# Patient Record
Sex: Female | Born: 1985 | Race: Black or African American | Hispanic: No | Marital: Single | Smoking: Never smoker
Health system: Southern US, Community
[De-identification: ages and names within clinical notes are randomized; demographics above are authoritative.]

---

## 2019-10-20 ENCOUNTER — Other Ambulatory Visit: Payer: Self-pay

## 2019-10-20 ENCOUNTER — Encounter (HOSPITAL_BASED_OUTPATIENT_CLINIC_OR_DEPARTMENT_OTHER): Payer: Self-pay | Admitting: Emergency Medicine

## 2019-10-20 ENCOUNTER — Emergency Department (HOSPITAL_BASED_OUTPATIENT_CLINIC_OR_DEPARTMENT_OTHER)
Admission: EM | Admit: 2019-10-20 | Discharge: 2019-10-20 | Disposition: A | Discharge status: Home / Self Care | Payer: Medicaid Other | Attending: Emergency Medicine | Admitting: Emergency Medicine

## 2019-10-20 DIAGNOSIS — Z5321 Procedure and treatment not carried out due to patient leaving prior to being seen by health care provider: Secondary | ICD-10-CM | POA: Diagnosis not present

## 2019-10-20 DIAGNOSIS — O219 Vomiting of pregnancy, unspecified: Secondary | ICD-10-CM | POA: Diagnosis not present

## 2019-10-20 DIAGNOSIS — Z3A09 9 weeks gestation of pregnancy: Secondary | ICD-10-CM | POA: Diagnosis not present

## 2019-10-20 NOTE — ED Notes (Signed)
Pt left without staff being made aware. All bathrooms and waiting areas checked.

## 2019-10-20 NOTE — ED Notes (Signed)
Pt states she wants a second opinion.

## 2019-10-20 NOTE — ED Triage Notes (Addendum)
Vomiting x6 weeks.  Pt is [redacted] wks pregnant based on last period. Broken blood vessels in right eye from vomiting. Has not started OBGYN care yet.  2nd pregnancy. First pregnancy uneventful. Pt had [place] today at [facility] and was seen and received prescriptions. Per Pt, She has not picked them up yet.

## 2019-10-20 NOTE — ED Provider Notes (Signed)
Patient left without being seen.  I checked the room and the gown is in the bed patient is not present.   Arby Barrette, MD 10/20/19 2259

## 2019-10-21 ENCOUNTER — Emergency Department (HOSPITAL_BASED_OUTPATIENT_CLINIC_OR_DEPARTMENT_OTHER)
Admission: EM | Admit: 2019-10-21 | Discharge: 2019-10-21 | Disposition: A | Discharge status: Home / Self Care | Payer: Medicaid Other | Attending: Emergency Medicine | Admitting: Emergency Medicine

## 2019-10-21 ENCOUNTER — Emergency Department (HOSPITAL_BASED_OUTPATIENT_CLINIC_OR_DEPARTMENT_OTHER): Payer: Medicaid Other

## 2019-10-21 ENCOUNTER — Encounter (HOSPITAL_BASED_OUTPATIENT_CLINIC_OR_DEPARTMENT_OTHER): Payer: Self-pay | Admitting: *Deleted

## 2019-10-21 DIAGNOSIS — R102 Pelvic and perineal pain: Secondary | ICD-10-CM | POA: Diagnosis present

## 2019-10-21 DIAGNOSIS — O021 Missed abortion: Secondary | ICD-10-CM | POA: Insufficient documentation

## 2019-10-21 DIAGNOSIS — R1032 Left lower quadrant pain: Secondary | ICD-10-CM

## 2019-10-21 LAB — CBC WITH DIFFERENTIAL/PLATELET
Abs Immature Granulocytes: 0.06 10*3/uL (ref 0.00–0.07)
Basophils Absolute: 0 10*3/uL (ref 0.0–0.1)
Basophils Relative: 0 %
Eosinophils Absolute: 0 10*3/uL (ref 0.0–0.5)
Eosinophils Relative: 0 %
HCT: 40 % (ref 36.0–46.0)
Hemoglobin: 12.9 g/dL (ref 12.0–15.0)
Immature Granulocytes: 1 %
Lymphocytes Relative: 24 %
Lymphs Abs: 2.8 10*3/uL (ref 0.7–4.0)
MCH: 28.9 pg (ref 26.0–34.0)
MCHC: 32.3 g/dL (ref 30.0–36.0)
MCV: 89.7 fL (ref 80.0–100.0)
Monocytes Absolute: 0.7 10*3/uL (ref 0.1–1.0)
Monocytes Relative: 6 %
Neutro Abs: 7.9 10*3/uL — ABNORMAL HIGH (ref 1.7–7.7)
Neutrophils Relative %: 69 %
Platelets: 299 10*3/uL (ref 150–400)
RBC: 4.46 MIL/uL (ref 3.87–5.11)
RDW: 13.6 % (ref 11.5–15.5)
WBC: 11.5 10*3/uL — ABNORMAL HIGH (ref 4.0–10.5)
nRBC: 0 % (ref 0.0–0.2)

## 2019-10-21 LAB — COMPREHENSIVE METABOLIC PANEL
ALT: 12 U/L (ref 0–44)
AST: 14 U/L — ABNORMAL LOW (ref 15–41)
Albumin: 3.8 g/dL (ref 3.5–5.0)
Alkaline Phosphatase: 38 U/L (ref 38–126)
Anion gap: 11 (ref 5–15)
BUN: 8 mg/dL (ref 6–20)
CO2: 23 mmol/L (ref 22–32)
Calcium: 9 mg/dL (ref 8.9–10.3)
Chloride: 99 mmol/L (ref 98–111)
Creatinine, Ser: 0.69 mg/dL (ref 0.44–1.00)
GFR, Estimated: >60 mL/min (ref >60)
Glucose, Bld: 94 mg/dL (ref 70–99)
Potassium: 3.7 mmol/L (ref 3.5–5.1)
Sodium: 133 mmol/L — ABNORMAL LOW (ref 135–145)
Total Bilirubin: 0.1 mg/dL — ABNORMAL LOW (ref 0.3–1.2)
Total Protein: 7.2 g/dL (ref 6.5–8.1)

## 2019-10-21 LAB — WET PREP, GENITAL
Clue Cells Wet Prep HPF POC: NONE SEEN
Sperm: NONE SEEN
Trich, Wet Prep: NONE SEEN
Yeast Wet Prep HPF POC: NONE SEEN

## 2019-10-21 LAB — URINALYSIS, ROUTINE W REFLEX MICROSCOPIC
Bilirubin Urine: NEGATIVE
Glucose, UA: NEGATIVE mg/dL
Ketones, ur: NEGATIVE mg/dL
Leukocytes,Ua: NEGATIVE
Nitrite: NEGATIVE
Protein, ur: NEGATIVE mg/dL
Specific Gravity, Urine: 1.025 (ref 1.005–1.030)
pH: 6 (ref 5.0–8.0)

## 2019-10-21 LAB — URINALYSIS, MICROSCOPIC (REFLEX)

## 2019-10-21 LAB — LIPASE, BLOOD: Lipase: 23 U/L (ref 11–51)

## 2019-10-21 LAB — HCG, QUANTITATIVE, PREGNANCY: hCG, Beta Chain, Quant, S: 176327 m[IU]/mL — ABNORMAL HIGH (ref <5)

## 2019-10-21 NOTE — ED Triage Notes (Signed)
Pt states she is [redacted] weeks pregnant, and her head and left side are "sore from all the vomiting". Broken blood vessels noted in right eye, pt states appeared after vomiting episode. Pt had [place] and rx at [facility], pt has not picked up rx yet, states she does not want to take the meds they prescribed and does not plan to get her rx filled. No OB care yet. Denies any bleeding or discharge.

## 2019-10-21 NOTE — ED Notes (Signed)
ED Provider at bedside. 

## 2019-10-21 NOTE — ED Provider Notes (Signed)
[facility] [place] EMERGENCY DEPARTMENT Provider Note   CSN: [id number] Arrival date & time: 10/21/19  0800     History Chief Complaint  Patient presents with  . 9 weeks preg/left side pain    Holly Fernandez is a 34 y.o. female.  Pelvic pain, similar to menstrual cramping, last few days, [redacted] weeks pregnant, nothing makes it better or worse, no vaginal discharge no vaginal bleeding, tolerating p.o. but intermittent vomiting.  Normal bowel movements.  History of normal pregnancy 5 years ago        History reviewed. No pertinent past medical history.  There are no problems to display for this patient.   History reviewed. No pertinent surgical history.   OB History    Gravida  1   Para      Term      Preterm      AB      Living        SAB      TAB      Ectopic      Multiple      Live Births              History reviewed. No pertinent family history.  Social History   Tobacco Use  . Smoking status: Never Smoker  . Smokeless tobacco: Never Used  Substance Use Topics  . Alcohol use: Never  . Drug use: Never    Home Medications Prior to Admission medications   Not on File    Allergies    Patient has no known allergies.  Review of Systems   Review of Systems  Constitutional: Negative for chills and fever.  HENT: Negative for congestion and rhinorrhea.   Respiratory: Negative for cough and shortness of breath.   Cardiovascular: Negative for chest pain and palpitations.  Gastrointestinal: Negative for diarrhea, nausea and vomiting.  Genitourinary: Positive for pelvic pain. Negative for difficulty urinating, dysuria, vaginal bleeding and vaginal discharge.  Musculoskeletal: Negative for arthralgias and back pain.  Skin: Negative for rash and wound.  Neurological: Negative for light-headedness and headaches.    Physical Exam Updated Vital Signs BP 139/73 (BP Location: Right Arm)   Pulse 84   Temp 98.6 F (37 C) (Oral)   Resp 18    Ht 5\' 5"  (1.651 m)   Wt 81.6 kg   LMP 08/17/2019   SpO2 100%   BMI 29.95 kg/m   Physical Exam Vitals and nursing note reviewed. Exam conducted with a chaperone present.  Constitutional:      General: She is not in acute distress.    Appearance: Normal appearance.  HENT:     Head: Normocephalic and atraumatic.     Nose: No rhinorrhea.  Eyes:     General:        Right eye: No discharge.        Left eye: No discharge.     Conjunctiva/sclera: Conjunctivae normal.  Cardiovascular:     Rate and Rhythm: Normal rate and regular rhythm.  Pulmonary:     Effort: Pulmonary effort is normal. No respiratory distress.     Breath sounds: No stridor.  Abdominal:     General: Abdomen is flat. There is no distension.     Palpations: Abdomen is soft.  Genitourinary:    Cervix: Dilated. No cervical motion tenderness, discharge or friability.  Musculoskeletal:        General: No tenderness or signs of injury.  Skin:    General: Skin is warm and dry.  Neurological:     General: No focal deficit present.     Mental Status: She is alert. Mental status is at baseline.     Motor: No weakness.  Psychiatric:        Mood and Affect: Mood normal.        Behavior: Behavior normal.     ED Results / Procedures / Treatments   Labs (all labs ordered are listed, but only abnormal results are displayed) Labs Reviewed  WET PREP, GENITAL - Abnormal; Notable for the following components:      Result Value   WBC, Wet Prep HPF POC MODERATE (*)    All other components within normal limits  CBC WITH DIFFERENTIAL/PLATELET - Abnormal; Notable for the following components:   WBC 11.5 (*)    Neutro Abs 7.9 (*)    All other components within normal limits  COMPREHENSIVE METABOLIC PANEL - Abnormal; Notable for the following components:   Sodium 133 (*)    AST 14 (*)    Total Bilirubin 0.1 (*)    All other components within normal limits  URINALYSIS, ROUTINE W REFLEX MICROSCOPIC - Abnormal; Notable for the  following components:   Hgb urine dipstick SMALL (*)    All other components within normal limits  HCG, QUANTITATIVE, PREGNANCY - Abnormal; Notable for the following components:   hCG, Beta Chain, Quant, S [id number] (*)    All other components within normal limits  URINALYSIS, MICROSCOPIC (REFLEX) - Abnormal; Notable for the following components:   Bacteria, UA MANY (*)    All other components within normal limits  LIPASE, BLOOD  GC/CHLAMYDIA PROBE AMP ([place]) NOT AT [facility]    EKG None  Radiology US OB LESS THAN 14 WEEKS WITH OB TRANSVAGINAL  Result Date: 10/21/2019 CLINICAL DATA:  Abdominal pain, first trimester of pregnancy. EXAM: OBSTETRIC <14 WK [place] AND TRANSVAGINAL OB US TECHNIQUE: Both transabdominal and transvaginal ultrasound examinations were performed for complete evaluation of the gestation as well as the maternal uterus, adnexal regions, and pelvic cul-de-sac. Transvaginal technique was performed to assess early pregnancy. COMPARISON:  None. FINDINGS: Intrauterine gestational sac: Single Yolk sac:  Visualized. Embryo:  Visualized. Cardiac Activity: Not Visualized. CRL:  26 mm   9 w   2 d                  [place] EDC: May 23, 2020. Subchorionic hemorrhage:  Small subchronic hemorrhage is noted. Maternal uterus/adnexae: Ovaries are not visualized. No free fluid is noted. IMPRESSION: Findings meet definitive criteria for failed pregnancy. This follows SRU consensus guidelines: Diagnostic Criteria for Nonviable Pregnancy Early in the First Trimester. [facility] Mis J Med [phone]. Electronically Signed   By: Lupita Raider M.D.   On: 10/21/2019 10:43    Procedures Procedures (including critical care time)  Medications Ordered in ED Medications - No data to display  ED Course  I have reviewed the triage vital signs and the nursing notes.  Pertinent labs & imaging results that were available during my care of the patient were reviewed by me and considered in my medical decision  making (see chart for details).    MDM Rules/Calculators/A&P                         Pelvic pain  Concern for missed abortion, similar presentation to OB/GYN, outpatient follow-up and plan per their office.  Labs otherwise unremarkable.  I reviewed the ultrasound imaging report and I reviewed the labs.  She is safe for discharge home strict return precautions are provided Final Clinical Impression(s) / ED Diagnoses Final diagnoses:  Missed abortion    Rx / DC Orders ED Discharge Orders    None       Sabino Donovan, MD 10/21/19 1115

## 2019-10-21 NOTE — Discharge Instructions (Addendum)
Derek Jack., PA-C   [address]   [place], [place] [address]   Phone: [phone]   Fax: [phone]

## 2019-10-22 LAB — GC/CHLAMYDIA PROBE AMP (~~LOC~~) NOT AT ARMC
Chlamydia: NEGATIVE
Comment: NEGATIVE
Comment: NORMAL
Neisseria Gonorrhea: NEGATIVE

## 2020-06-07 ENCOUNTER — Emergency Department (HOSPITAL_BASED_OUTPATIENT_CLINIC_OR_DEPARTMENT_OTHER)
Admission: EM | Admit: 2020-06-07 | Discharge: 2020-06-07 | Disposition: A | Discharge status: Home / Self Care | Payer: Medicaid Other | Attending: Emergency Medicine | Admitting: Emergency Medicine

## 2020-06-07 ENCOUNTER — Emergency Department (HOSPITAL_BASED_OUTPATIENT_CLINIC_OR_DEPARTMENT_OTHER): Payer: Medicaid Other

## 2020-06-07 ENCOUNTER — Other Ambulatory Visit: Payer: Self-pay

## 2020-06-07 ENCOUNTER — Encounter (HOSPITAL_BASED_OUTPATIENT_CLINIC_OR_DEPARTMENT_OTHER): Payer: Self-pay | Admitting: Emergency Medicine

## 2020-06-07 DIAGNOSIS — B349 Viral infection, unspecified: Secondary | ICD-10-CM | POA: Diagnosis not present

## 2020-06-07 DIAGNOSIS — R509 Fever, unspecified: Secondary | ICD-10-CM

## 2020-06-07 DIAGNOSIS — U071 COVID-19: Secondary | ICD-10-CM | POA: Diagnosis not present

## 2020-06-07 LAB — URINALYSIS, ROUTINE W REFLEX MICROSCOPIC
Bilirubin Urine: NEGATIVE
Glucose, UA: NEGATIVE mg/dL
Ketones, ur: NEGATIVE mg/dL
Leukocytes,Ua: NEGATIVE
Nitrite: NEGATIVE
Protein, ur: NEGATIVE mg/dL
Specific Gravity, Urine: 1.02 (ref 1.005–1.030)
pH: 6 (ref 5.0–8.0)

## 2020-06-07 LAB — URINALYSIS, MICROSCOPIC (REFLEX)

## 2020-06-07 LAB — SARS CORONAVIRUS 2 (TAT 6-24 HRS): SARS Coronavirus 2: POSITIVE — AB

## 2020-06-07 MED ORDER — IBUPROFEN 800 MG PO TABS
800.0000 mg | ORAL_TABLET | Freq: Once | ORAL | Status: AC
  Administered 2020-06-07: 800 mg via ORAL
  Filled 2020-06-07: qty 1

## 2020-06-07 NOTE — ED Notes (Signed)
Report from Rebecca, RN

## 2020-06-07 NOTE — ED Provider Notes (Signed)
[facility] [place] EMERGENCY DEPARTMENT Provider Note   CSN: [id number] Arrival date & time: 06/07/20  0459     History Chief Complaint  Patient presents with   Fever    Latrica Clowers is a 35 y.o. female.   Fever Temp source:  Subjective Severity:  Mild Onset quality:  Sudden Timing:  Constant Progression:  Waxing and waning Chronicity:  Recurrent Relieved by:  None tried Worsened by:  Nothing Ineffective treatments:  None tried Associated symptoms: cough, myalgias and rhinorrhea   Associated symptoms: no chest pain and no diarrhea       History reviewed. No pertinent past medical history.  There are no problems to display for this patient.   History reviewed. No pertinent surgical history.   OB History     Gravida  1   Para      Term      Preterm      AB      Living         SAB      IAB      Ectopic      Multiple      Live Births              History reviewed. No pertinent family history.  Social History   Tobacco Use   Smoking status: Never Smoker   Smokeless tobacco: Never Used  Vaping Use   Vaping Use: Never used  Substance Use Topics   Alcohol use: Never   Drug use: Never    Home Medications Prior to Admission medications   Not on File    Allergies    Patient has no known allergies.  Review of Systems   Review of Systems  Constitutional:  Positive for fever.  HENT:  Positive for rhinorrhea.   Respiratory:  Positive for cough.   Cardiovascular:  Negative for chest pain.  Gastrointestinal:  Negative for diarrhea.  Musculoskeletal:  Positive for myalgias.  All other systems reviewed and are negative.  Physical Exam Updated Vital Signs BP 124/78 (BP Location: Right Arm)   Pulse (!) 106   Temp 99.4 F (37.4 C) (Oral)   Resp 18   Ht 5\' 5"  (1.651 m)   Wt 80.7 kg   LMP 05/20/2020 (Exact Date)   SpO2 98%   Breastfeeding Unknown   BMI 29.62 kg/m   Physical Exam Vitals and nursing note reviewed.   Constitutional:      Appearance: She is well-developed.  HENT:     Head: Normocephalic and atraumatic.     Mouth/Throat:     Mouth: Mucous membranes are moist.     Pharynx: Oropharynx is clear.  Eyes:     Pupils: Pupils are equal, round, and reactive to light.  Cardiovascular:     Rate and Rhythm: Normal rate and regular rhythm.  Pulmonary:     Effort: No respiratory distress.     Breath sounds: No stridor.  Abdominal:     General: Abdomen is flat. There is no distension.  Musculoskeletal:        General: No swelling or tenderness. Normal range of motion.     Cervical back: Normal range of motion.  Skin:    General: Skin is warm and dry.  Neurological:     General: No focal deficit present.     Mental Status: She is alert.    ED Results / Procedures / Treatments   Labs (all labs ordered are listed, but only abnormal results are displayed) Labs  Reviewed  URINALYSIS, ROUTINE W REFLEX MICROSCOPIC - Abnormal; Notable for the following components:      Result Value   APPearance HAZY (*)    Hgb urine dipstick MODERATE (*)    All other components within normal limits  URINALYSIS, MICROSCOPIC (REFLEX) - Abnormal; Notable for the following components:   Bacteria, UA RARE (*)    All other components within normal limits  SARS CORONAVIRUS 2 (TAT 6-24 HRS)    EKG None  Radiology DG Chest Portable 1 View  Result Date: 06/07/2020 CLINICAL DATA:  Fever, body aches. EXAM: PORTABLE CHEST 1 VIEW COMPARISON:  No prior. FINDINGS: Mediastinum and hilar structures normal. Borderline cardiomegaly and pulmonary venous congestion. Findings may be related to portable technique. Low lung volumes. Mild right base infiltrate cannot be excluded. No pleural effusion or pneumothorax. IMPRESSION: Borderline cardiomegaly and pulmonary venous congestion. Findings may be related to portable technique. 2. Low lung volumes. Mild right base infiltrate cannot be completely excluded. Electronically Signed    By: Maisie Fus  Register   On: 06/07/2020 07:25    Procedures Procedures   Medications Ordered in ED Medications  ibuprofen (ADVIL) tablet 800 mg (800 mg Oral Given 06/07/20 0609)    ED Course  I have reviewed the triage vital signs and the nursing notes.  Pertinent labs & imaging results that were available during my care of the patient were reviewed by me and considered in my medical decision making (see chart for details).    MDM Rules/Calculators/A&P                          Fever, likely viral syndrome. Plan for supportive care at home.   Final Clinical Impression(s) / ED Diagnoses Final diagnoses:  Viral syndrome  Fever, unspecified fever cause    Rx / DC Orders ED Discharge Orders     None        Monica Zahler, Barbara Cower, MD 06/13/20 [phone]

## 2020-06-07 NOTE — ED Triage Notes (Signed)
Pt is c/o fever, body aches and headaches  Sxs started on Sunday

## 2021-08-04 IMAGING — US US OB < 14 WEEKS - US OB TV
1 series · 14 of 22 positions shown · non-contrast
Comparison: None.

CLINICAL DATA: Abdominal pain, first trimester of pregnancy.

EXAM:
OBSTETRIC <14 WK US AND TRANSVAGINAL OB US
TECHNIQUE: Both transabdominal and transvaginal ultrasound examinations were
performed for complete evaluation of the gestation as well as the
maternal uterus, adnexal regions, and pelvic cul-de-sac.
Transvaginal technique was performed to assess early pregnancy.

[Series 1: us ob < 14 weeks - us ob tv · 22 acquisitions, 14 frames shown]
[im 1/22]
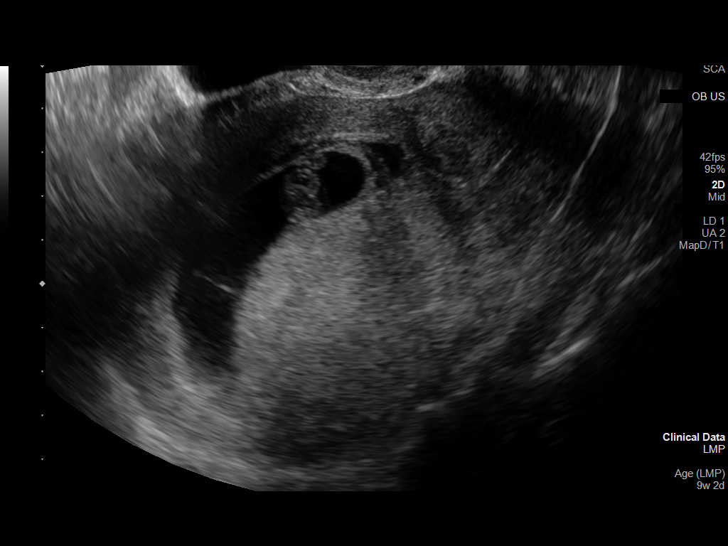
[im 3/22]
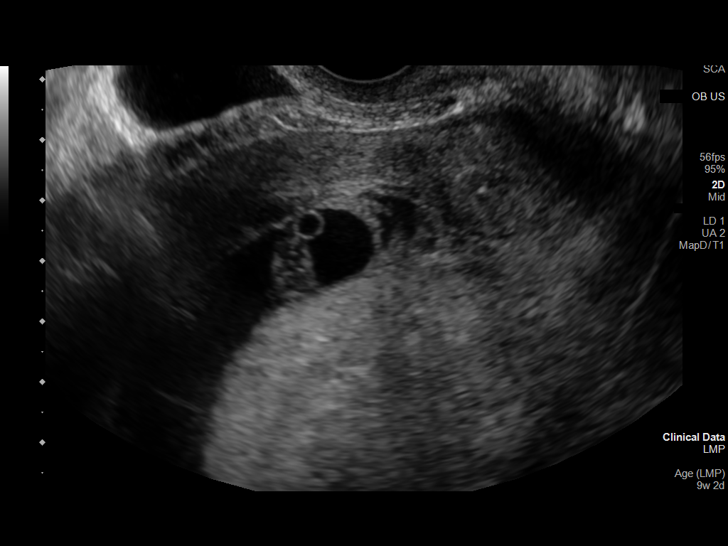
[im 4/22]
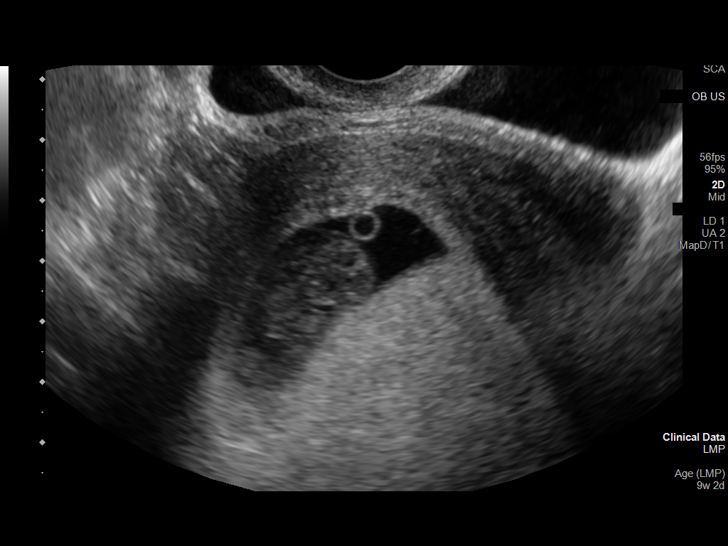
[im 6/22]
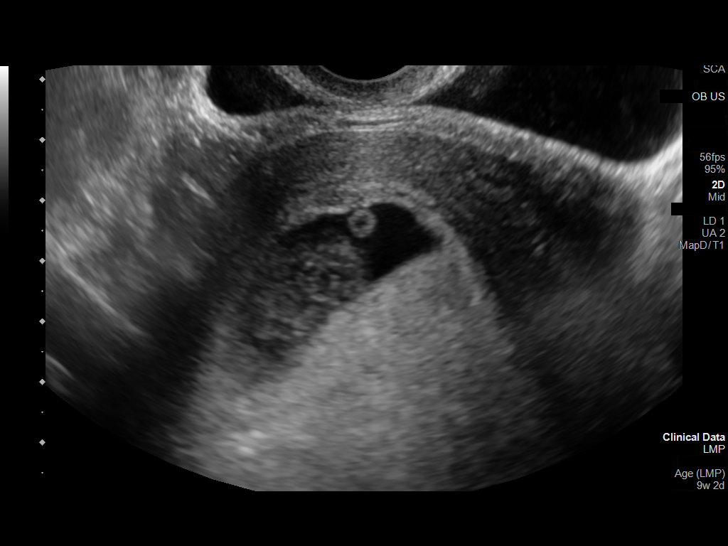
[im 8/22]
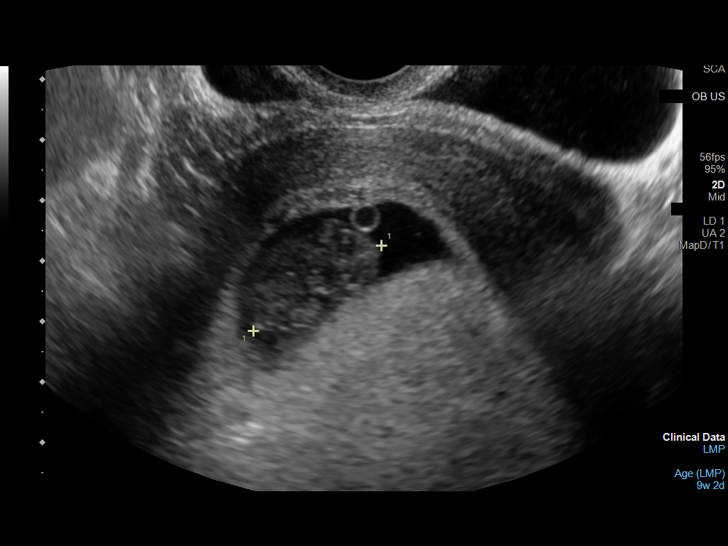
[im 9/22]
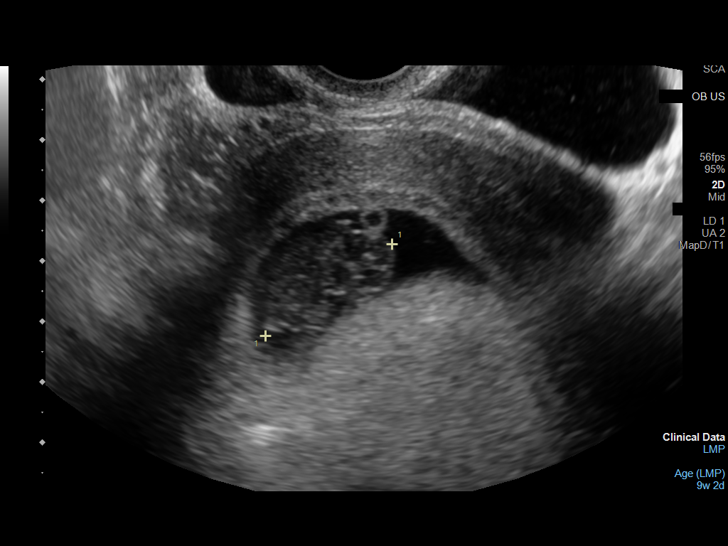
[im 11/22]
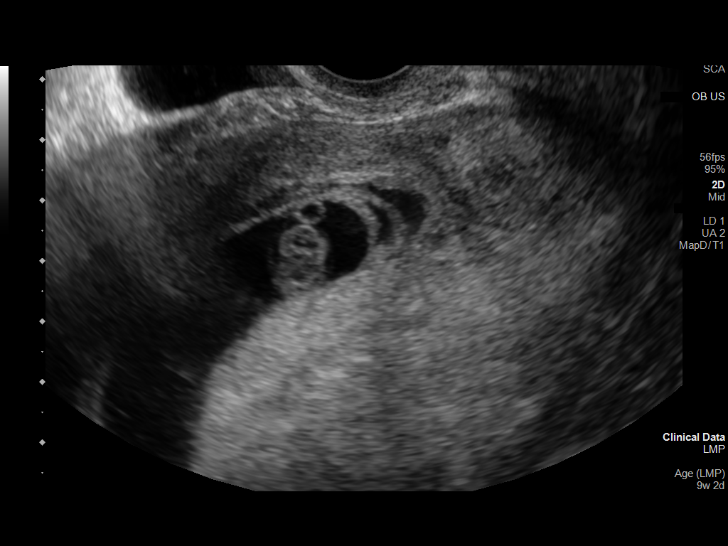
[im 12/22]
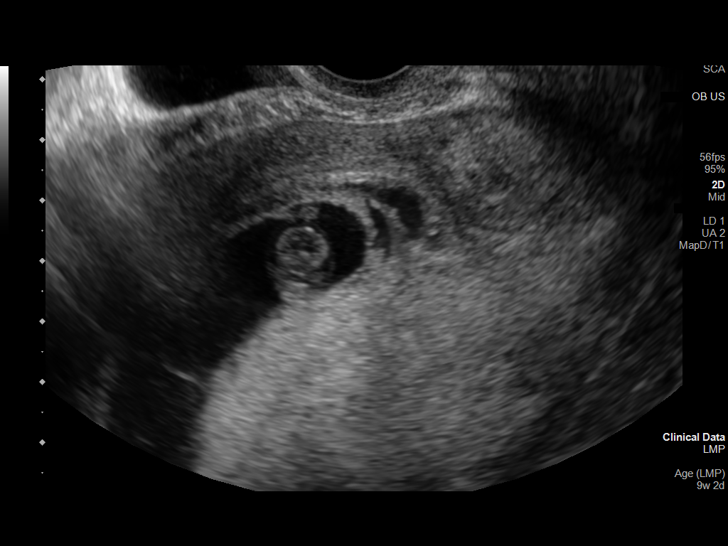
[im 14/22]
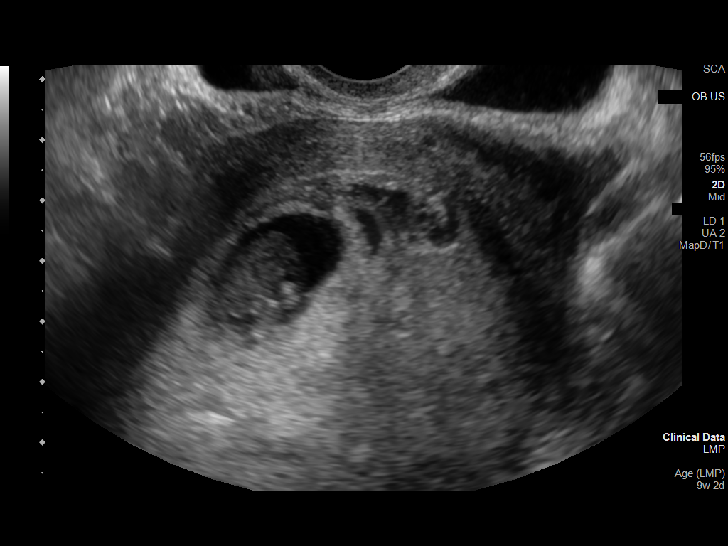
[im 15/22]
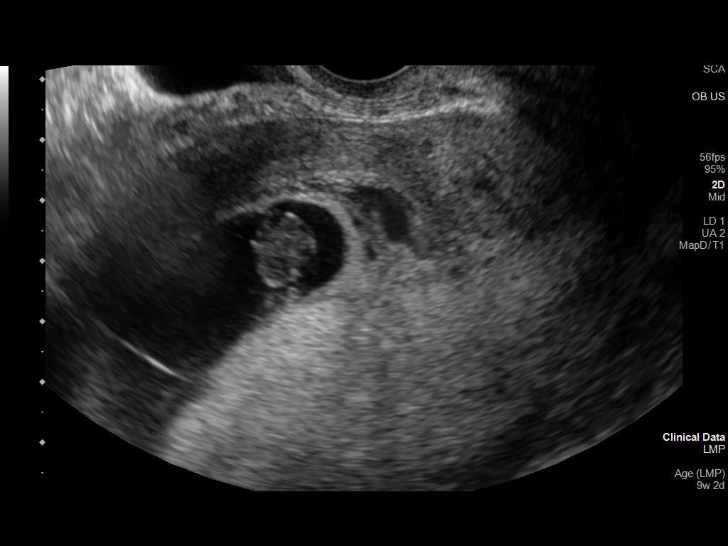
[im 17/22]
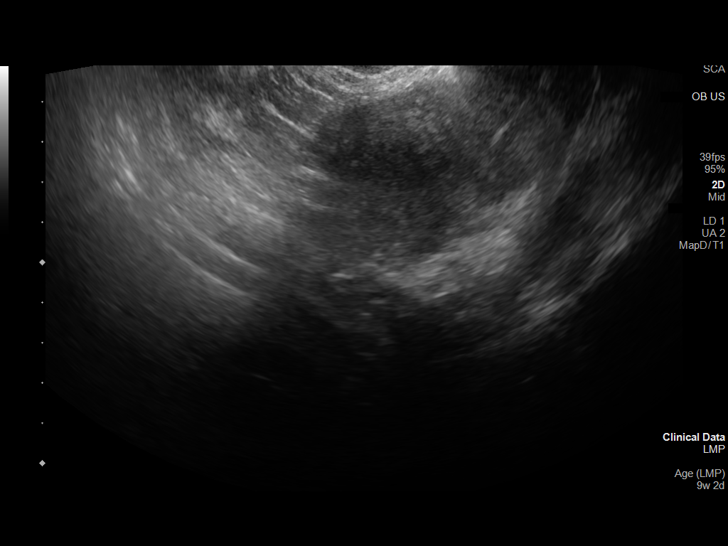
[im 19/22]
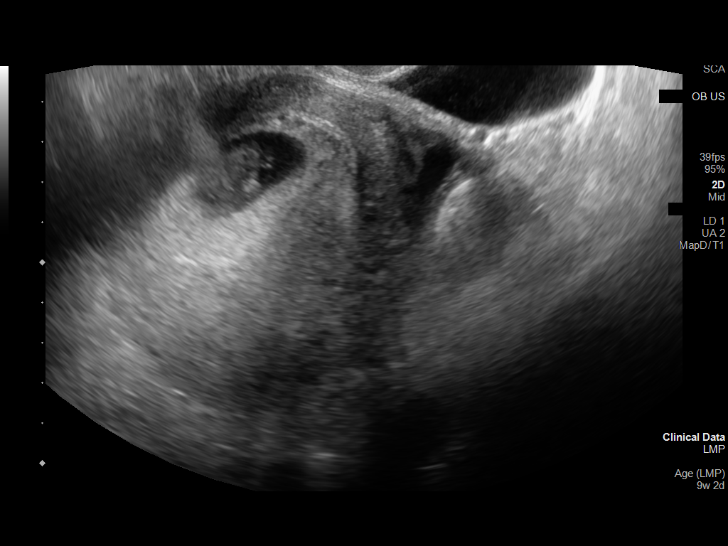
[im 20/22]
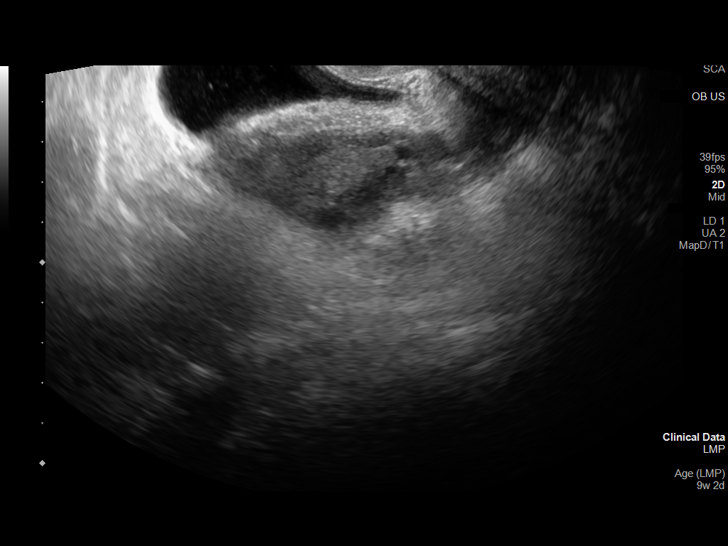
[im 22/22]
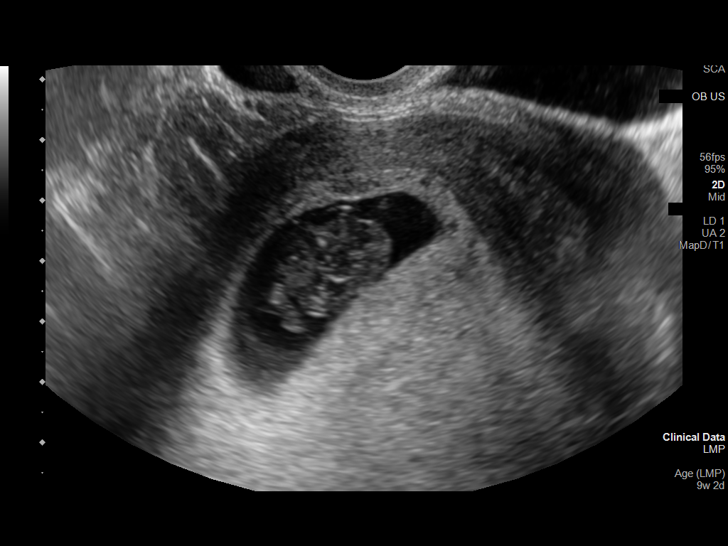

[14 of 22 positions shown; findings below may reference images not displayed]

FINDINGS: Intrauterine gestational sac: Single

Yolk sac:  Visualized.

Embryo:  Visualized.

Cardiac Activity: Not Visualized.

CRL:  26 mm   9 w   2 d                  US EDC: May 23, 2020.

Subchorionic hemorrhage:  Small subchronic hemorrhage is noted.

Maternal uterus/adnexae: Ovaries are not visualized. No free fluid
is noted.
IMPRESSION: Findings meet definitive criteria for failed pregnancy. This follows
SRU consensus guidelines: Diagnostic Criteria for Nonviable
Pregnancy Early in the First Trimester. N Engl J Med

## 2022-03-22 IMAGING — DX DG CHEST 1V PORT
1 series · 1 of 1 positions shown · non-contrast
Comparison: No prior.

CLINICAL DATA: Fever, body aches.

EXAM:
PORTABLE CHEST 1 VIEW

[chest ap]
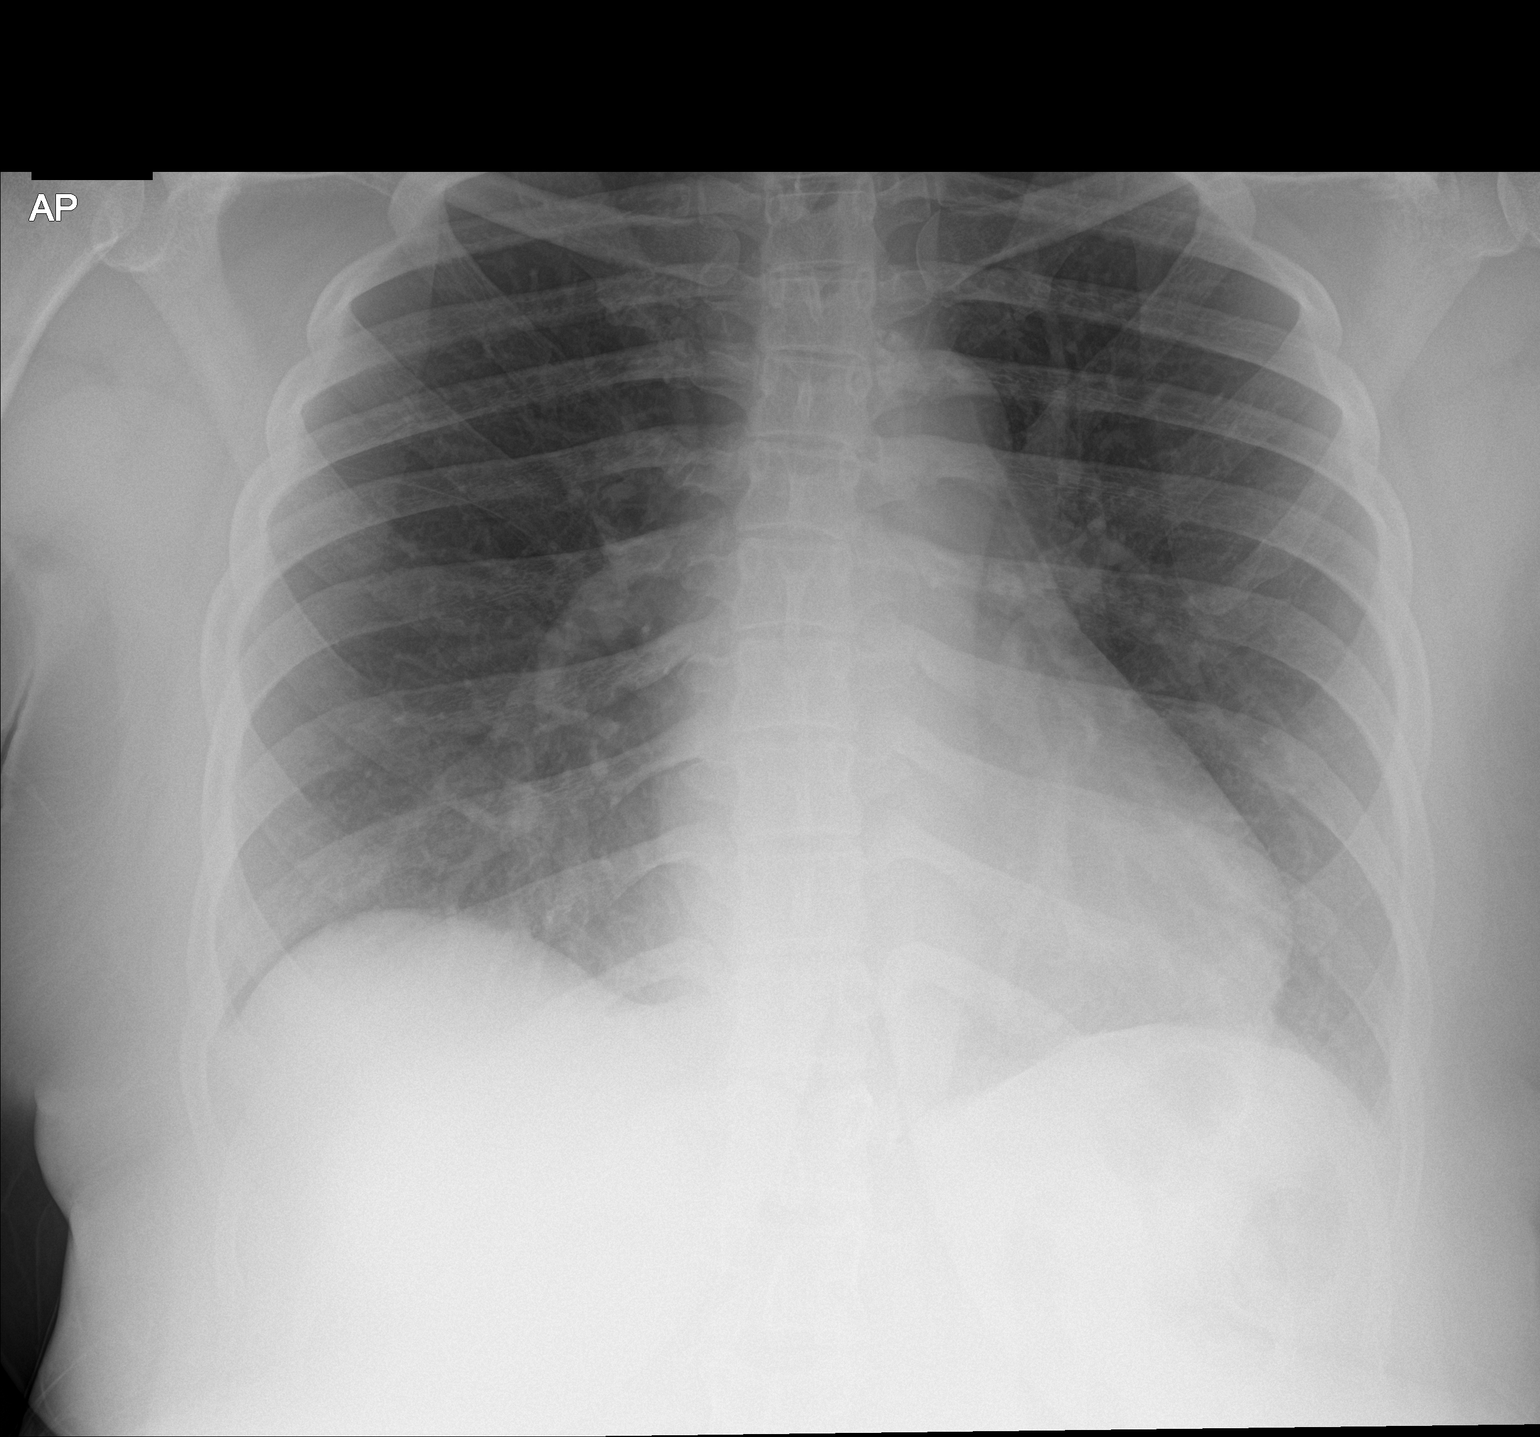

[1 of 1 positions shown; findings below may reference images not displayed]

FINDINGS: Mediastinum and hilar structures normal. Borderline cardiomegaly and
pulmonary venous congestion. Findings may be related to portable
technique. Low lung volumes. Mild right base infiltrate cannot be
excluded. No pleural effusion or pneumothorax.
IMPRESSION: Borderline cardiomegaly and pulmonary venous congestion. Findings
may be related to portable technique. 2. Low lung volumes. Mild
right base infiltrate cannot be completely excluded.
